# Patient Record
Sex: Male | Born: 2005 | Race: White | Hispanic: No | Marital: Single | Smoking: Never smoker
Health system: Southern US, Community
[De-identification: ages and names within clinical notes are randomized; demographics above are authoritative.]

## PROBLEM LIST (undated history)

## (undated) DIAGNOSIS — E039 Hypothyroidism, unspecified: Secondary | ICD-10-CM

## (undated) HISTORY — PX: ADENOIDECTOMY: SUR15

## (undated) HISTORY — PX: TONSILLECTOMY: SUR1361

## (undated) HISTORY — DX: Hypothyroidism, unspecified: E03.9

---
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
## 2025-01-27 ENCOUNTER — Ambulatory Visit: Admitting: "Endocrinology
